# Patient Record
Sex: Female | Born: 1990 | Race: White | Hispanic: No | Marital: Single | Smoking: Current every day smoker
Health system: Southern US, Community
[De-identification: ages and names within clinical notes are randomized; demographics above are authoritative.]

## PROBLEM LIST (undated history)

## (undated) DIAGNOSIS — I498 Other specified cardiac arrhythmias: Secondary | ICD-10-CM

## (undated) DIAGNOSIS — I951 Orthostatic hypotension: Secondary | ICD-10-CM

## (undated) DIAGNOSIS — F419 Anxiety disorder, unspecified: Secondary | ICD-10-CM

## (undated) DIAGNOSIS — R000 Tachycardia, unspecified: Secondary | ICD-10-CM

## (undated) DIAGNOSIS — F32A Depression, unspecified: Secondary | ICD-10-CM

## (undated) DIAGNOSIS — F329 Major depressive disorder, single episode, unspecified: Secondary | ICD-10-CM

## (undated) DIAGNOSIS — G90A Postural orthostatic tachycardia syndrome (POTS): Secondary | ICD-10-CM

---

## 2013-05-28 ENCOUNTER — Encounter (HOSPITAL_COMMUNITY): Payer: Self-pay | Admitting: Emergency Medicine

## 2013-05-28 ENCOUNTER — Emergency Department (HOSPITAL_COMMUNITY)
Admission: EM | Admit: 2013-05-28 | Discharge: 2013-05-28 | Disposition: A | Discharge status: Home / Self Care | Payer: BC Managed Care – PPO | Source: Home / Self Care | Attending: Family Medicine | Admitting: Family Medicine

## 2013-05-28 DIAGNOSIS — J039 Acute tonsillitis, unspecified: Secondary | ICD-10-CM

## 2013-05-28 LAB — POCT RAPID STREP A: Streptococcus, Group A Screen (Direct): NEGATIVE

## 2013-05-28 MED ORDER — CLINDAMYCIN HCL 300 MG PO CAPS: 300.0000 mg | ORAL_CAPSULE | Freq: Four times a day (QID) | ORAL | Status: DC

## 2013-05-28 MED ORDER — ACETAMINOPHEN 325 MG PO TABS
ORAL_TABLET | ORAL | Status: AC
  Filled 2013-05-28: qty 2

## 2013-05-28 MED ORDER — ACETAMINOPHEN 325 MG PO TABS
650.0000 mg | ORAL_TABLET | Freq: Once | ORAL | Status: AC
  Administered 2013-05-28: 650 mg via ORAL

## 2013-05-28 NOTE — ED Notes (Signed)
Pt  Reports  Symptoms   Of  sorethroat     With  Swollen    Glands  And  Fever  Started  2-3  Days  Ago

## 2013-05-28 NOTE — ED Provider Notes (Signed)
CSN: [id number]     Arrival date & time 05/28/13  1412 History   First MD Initiated Contact with Patient 05/28/13 1451     Chief Complaint  Patient presents with  . 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 No difficulty breathing, speaking. Only uncomfortable when swallowing.   Patient is a 23 y.o. female presenting with pharyngitis. The history is provided by the patient.  Sore Throat This is a new problem. Episode onset: 3 days ago. The problem occurs constantly. The problem has been gradually worsening. The symptoms are aggravated by swallowing. Nothing relieves the symptoms.    History reviewed. No pertinent past medical history. History reviewed. No pertinent past surgical history. History reviewed. No pertinent family history. History  Substance Use Topics  . Smoking status: Current Every Day Smoker  . Smokeless tobacco: Not on file  . Alcohol Use: Yes   OB History   Grav Para Term Preterm Abortions TAB SAB Ect Mult Living                 Review of Systems  All other systems reviewed and are negative.   Allergies  Pertussis vaccines  Home Medications   Prior to Admission medications   Not on File   BP 108/75  Pulse 95  Temp(Src) 101.1 F (38.4 C) (Oral)  Resp 16  SpO2 95%  LMP 05/21/2013 Physical Exam  Nursing note and vitals reviewed. Constitutional: She is oriented to person, place, and time. She appears well-developed and well-nourished. No distress.  HENT:  Head: Normocephalic and atraumatic.  Right Ear: Hearing, tympanic membrane, external ear and ear canal normal.  Left Ear: Hearing, tympanic membrane, external ear and ear canal normal.  Nose: Nose normal.  Mouth/Throat: Uvula is midline and mucous membranes are normal. No oral lesions. No trismus in the jaw. No uvula swelling. Oropharyngeal exudate, posterior  oropharyngeal edema and posterior oropharyngeal erythema present. No tonsillar abscesses.  Moderate bilateral tonsillar swelling with erythema and exudate. No clinical evidence of airway obstruction or peritonsillar abscess.   Eyes: Conjunctivae are normal. Right eye exhibits no discharge. Left eye exhibits no discharge. No scleral icterus.  Neck: Normal range of motion. Neck supple.  Cardiovascular: Normal rate, regular rhythm and normal heart sounds.   Pulmonary/Chest: Effort normal and breath sounds normal.  Musculoskeletal: Normal range of motion.  Neurological: She is alert and oriented to person, place, and time.  Skin: Skin is warm and dry. No rash noted.  Psychiatric: She has a normal mood and affect. Her behavior is normal.    ED Course  Procedures (including critical care time) Labs Review Labs Reviewed  POCT RAPID STREP A ([facility] ONLY)    Imaging Review No results found.   MDM   1. Tonsillitis with exudate    Rapid strep negative. Will treat for atypical causes of tonsillitis with clindamycin and advise follow up if no improvement.   Jess BartersJennifer Lee DixonPresson, GeorgiaPA 05/28/13 [phone]

## 2013-05-28 NOTE — ED Provider Notes (Signed)
Medical screening examination/treatment/procedure(s) were performed by a resident physician or non-physician practitioner and as the supervising physician I was immediately available for consultation/collaboration.  Clementeen GrahamEvan Lakynn Halvorsen, MD    Rodolph BongEvan S Emiyah Spraggins, MD 05/28/13 [phone]

## 2013-05-28 NOTE — Discharge Instructions (Signed)
Your strep test was negative. You are being treated for atypical causes of tonsillitis. Medication as directed with tylenol or ibuprofen as directed on packaging for pain and fever. Warm salt water gargles 4 x day until healed.  Tonsillitis Tonsillitis is an infection of the throat that causes the tonsils to become red, tender, and swollen. Tonsils are collections of lymphoid tissue at the back of the throat. Each tonsil has crevices (crypts). Tonsils help fight nose and throat infections and keep infection from spreading to other parts of the body for the first 18 months of life.  CAUSES Sudden (acute) tonsillitis is usually caused by infection with streptococcal bacteria. Long-lasting (chronic) tonsillitis occurs when the crypts of the tonsils become filled with pieces of food and bacteria, which makes it easy for the tonsils to become repeatedly infected. SYMPTOMS  Symptoms of tonsillitis include:  A sore throat, with possible difficulty swallowing.  White patches on the tonsils.  Fever.  Tiredness.  New episodes of snoring during sleep, when you did not snore before.  Small, foul-smelling, yellowish-white pieces of material (tonsilloliths) that you occasionally cough up or spit out. The tonsilloliths can also cause you to have bad breath. DIAGNOSIS Tonsillitis can be diagnosed through a physical exam. Diagnosis can be confirmed with the results of lab tests, including a throat culture. TREATMENT  The goals of tonsillitis treatment include the reduction of the severity and duration of symptoms and prevention of associated conditions. Symptoms of tonsillitis can be improved with the use of steroids to reduce the swelling. Tonsillitis caused by bacteria can be treated with antibiotics. Usually, treatment with antibiotics is started before the cause of the tonsillitis is known. However, if it is determined that the cause is not bacterial, antibiotics will not treat the tonsillitis. If attacks  of tonsillitis are severe and frequent, your caregiver may recommend surgery to remove the tonsils (tonsillectomy). HOME CARE INSTRUCTIONS   Rest as much as possible and get plenty of sleep.  Drink plenty of fluids. While the throat is very sore, eat soft foods or liquids, such as sherbet, soups, or instant breakfast drinks.  Eat frozen ice pops.  Gargle with a warm or cold liquid to help soothe the throat. Mix 1/4 teaspoon of salt and 1/4 teaspoon of baking soda in in 8 oz of water. SEEK MEDICAL CARE IF:   Large, tender lumps develop in your neck.  A rash develops.  A green, yellow-brown, or bloody substance is coughed up.  You are unable to swallow liquids or food for 24 hours.  You notice that only one of the tonsils is swollen. SEEK IMMEDIATE MEDICAL CARE IF:   You develop any new symptoms such as vomiting, severe headache, stiff neck, chest pain, or trouble breathing or swallowing.  You have severe throat pain along with drooling or voice changes.  You have severe pain, unrelieved with recommended medications.  You are unable to fully open the mouth.  You develop redness, swelling, or severe pain anywhere in the neck.  You have a fever. MAKE SURE YOU:   Understand these instructions.  Will watch your condition.  Will get help right away if you are not doing well or get worse. Document Released: 10/22/2004 Document Revised: 09/14/2012 Document Reviewed: 07/01/2012 [facility] Patient Information 2014 [employer] [place].

## 2013-05-30 LAB — CULTURE, GROUP A STREP

## 2013-05-30 NOTE — ED Notes (Signed)
Throat culture: Strep beta hemolytic not group A.  Pt. adequately treated with Clindamycin.  Needs notified. Anne Mendez Anne Mendez 05/30/2013

## 2013-05-31 ENCOUNTER — Telehealth (HOSPITAL_COMMUNITY): Payer: Self-pay | Admitting: *Deleted

## 2013-05-31 NOTE — ED Notes (Addendum)
I called but pt.'s mailbox is full, unable to leave a message.  Call 1. Anne Mendez 05/31/2013 Mailbox full.  Call 2. 06/01/2013 Mailbox full.  Call 3.  I called contact and left a message for pt. to call. 06/04/2013

## 2013-10-29 ENCOUNTER — Encounter (HOSPITAL_COMMUNITY): Payer: Self-pay | Admitting: Emergency Medicine

## 2013-10-29 ENCOUNTER — Emergency Department (INDEPENDENT_AMBULATORY_CARE_PROVIDER_SITE_OTHER)
Admission: EM | Admit: 2013-10-29 | Discharge: 2013-10-29 | Disposition: A | Discharge status: Home / Self Care | Payer: BC Managed Care – PPO | Source: Home / Self Care | Attending: Family Medicine | Admitting: Family Medicine

## 2013-10-29 DIAGNOSIS — J302 Other seasonal allergic rhinitis: Secondary | ICD-10-CM

## 2013-10-29 DIAGNOSIS — T7840XA Allergy, unspecified, initial encounter: Secondary | ICD-10-CM

## 2013-10-29 DIAGNOSIS — B3731 Acute candidiasis of vulva and vagina: Secondary | ICD-10-CM

## 2013-10-29 DIAGNOSIS — Z889 Allergy status to unspecified drugs, medicaments and biological substances status: Secondary | ICD-10-CM

## 2013-10-29 DIAGNOSIS — B373 Candidiasis of vulva and vagina: Secondary | ICD-10-CM

## 2013-10-29 MED ORDER — FLUTICASONE PROPIONATE 50 MCG/ACT NA SUSP: 1.0000 | Freq: Two times a day (BID) | NASAL | Status: DC

## 2013-10-29 MED ORDER — TERCONAZOLE 80 MG VA SUPP: 80.0000 mg | Freq: Every day | VAGINAL | Status: DC

## 2013-10-29 MED ORDER — TRIAMCINOLONE ACETONIDE 40 MG/ML IJ SUSP
40.0000 mg | Freq: Once | INTRAMUSCULAR | Status: AC
  Administered 2013-10-29: 40 mg via INTRAMUSCULAR

## 2013-10-29 MED ORDER — CETIRIZINE HCL 10 MG PO TABS: 10.0000 mg | ORAL_TABLET | Freq: Every day | ORAL | Status: DC

## 2013-10-29 MED ORDER — TRIAMCINOLONE ACETONIDE 40 MG/ML IJ SUSP
INTRAMUSCULAR | Status: AC
  Filled 2013-10-29: qty 1

## 2013-10-29 NOTE — ED Provider Notes (Signed)
CSN: [id number]     Arrival date & time 10/29/13  1341 History   First MD Initiated Contact with Patient 10/29/13 1348     Chief Complaint  Patient presents with  . Sinusitis  . Allergic Reaction   (Consider location/radiation/quality/duration/timing/severity/associated sxs/prior Treatment) Patient is a 23 y.o. female presenting with allergic reaction and rash. The history is provided by the patient.  Allergic Reaction Presenting symptoms: rash   Rash Location:  Full body Quality: redness   Quality: not painful, not scaling and not swelling   Severity:  Mild Onset quality:  Sudden Duration:  2 days Progression:  Spreading Chronicity:  New Context: medications   Context comment:  Has had 2 courses of amox in past 10 d for sinus sx from [facility],, 2d with rash over body and vaginal itch.   History reviewed. No pertinent past medical history. History reviewed. No pertinent past surgical history. No family history on file. History  Substance Use Topics  . Smoking status: Current Every Day Smoker -- 0.33 packs/day    Types: Cigarettes  . Smokeless tobacco: Not on file  . Alcohol Use: Yes   OB History   Grav Para Term Preterm Abortions TAB SAB Ect Mult Living                 Review of Systems  Constitutional: Negative.   Respiratory: Negative.   Cardiovascular: Negative.   Gastrointestinal: Negative.   Genitourinary: Negative.   Skin: Positive for rash.    Allergies  Pertussis vaccines  Home Medications   Prior to Admission medications   Medication Sig Start Date End Date Taking? Authorizing Provider  amoxicillin-clavulanate (AUGMENTIN) 500-125 MG per tablet Take 1 tablet by mouth 3 (three) times daily.   Yes Historical Provider, MD  pseudoephedrine (SUDAFED) 120 MG 12 hr tablet Take 120 mg by mouth 2 (two) times daily.   Yes Historical Provider, MD  cetirizine (ZYRTEC) 10 MG tablet Take 1 tablet (10 mg total) by mouth daily. One tab daily for allergies 10/29/13   Linna Hoff, MD  clindamycin (CLEOCIN) 300 MG capsule Take 1 capsule (300 mg total) by mouth 4 (four) times daily. X 10 days 05/28/13   Mathis Fare Presson, PA  fluticasone (FLONASE) 50 MCG/ACT nasal spray Place 1 spray into both nostrils 2 (two) times daily. 10/29/13   Linna Hoff, MD  terconazole (TERAZOL 3) 80 MG vaginal suppository Place 1 suppository (80 mg total) vaginally at bedtime. 10/29/13   Linna Hoff, MD   BP 133/88  Pulse 72  Temp(Src) 98.6 F (37 C) (Oral)  Resp 18  SpO2 100%  LMP 09/23/2013 Physical Exam  Nursing note and vitals reviewed. Constitutional: She is oriented to person, place, and time. She appears well-developed and well-nourished. No distress.  HENT:  Head: Normocephalic.  Right Ear: External ear normal.  Left Ear: External ear normal.  Nose: Nose normal.  Mouth/Throat: Oropharynx is clear and moist.  Eyes: Conjunctivae and EOM are normal. Pupils are equal, round, and reactive to light.  Neck: Normal range of motion. Neck supple.  Cardiovascular: Normal heart sounds.   Pulmonary/Chest: Effort normal and breath sounds normal.  Abdominal: Soft. Bowel sounds are normal. She exhibits no distension and no mass. There is no tenderness. There is no rebound and no guarding.  Neurological: She is alert and oriented to person, place, and time.  Skin: Skin is warm and dry. Rash noted. There is erythema.  m-p erythem generalized rash c/w drug exanthem.  ED Course  Procedures (including critical care time) Labs Review Labs Reviewed - No data to display  Imaging Review No results found.   MDM   1. Allergic reaction caused by a drug   2. Seasonal allergies   3. Candidal vaginitis        Linna HoffJames D Mendy Chou, MD 10/29/13 1423

## 2013-10-29 NOTE — Discharge Instructions (Signed)
No more amoxicillin, take medicine prescribed and see your doctor as needed.

## 2013-10-29 NOTE — ED Notes (Addendum)
Patient c/o ear pain and nasal congestion with sinus pressure x 9 days. Patient reports she was seen and treated on campus for an ear infection. She  Was given amoxicillin and then began to have sinus pressure and pain. She went back and was given another medication with amoxicillin in it and began to break out in a rash all over trunk and have a yeast infection. She used OTC tx for yeast infection and noticed rash between her thighs. Patient denies fever. Patient is alert and oriented and in NAD.

## 2015-05-25 ENCOUNTER — Emergency Department (HOSPITAL_COMMUNITY)
Admission: EM | Admit: 2015-05-25 | Discharge: 2015-05-25 | Disposition: A | Discharge status: Home / Self Care | Payer: BLUE CROSS/BLUE SHIELD | Attending: Emergency Medicine | Admitting: Emergency Medicine

## 2015-05-25 ENCOUNTER — Encounter (HOSPITAL_COMMUNITY): Payer: Self-pay | Admitting: Emergency Medicine

## 2015-05-25 ENCOUNTER — Emergency Department (HOSPITAL_COMMUNITY): Payer: BLUE CROSS/BLUE SHIELD

## 2015-05-25 DIAGNOSIS — F17210 Nicotine dependence, cigarettes, uncomplicated: Secondary | ICD-10-CM | POA: Diagnosis not present

## 2015-05-25 DIAGNOSIS — S61210A Laceration without foreign body of right index finger without damage to nail, initial encounter: Secondary | ICD-10-CM | POA: Insufficient documentation

## 2015-05-25 DIAGNOSIS — Z23 Encounter for immunization: Secondary | ICD-10-CM | POA: Diagnosis not present

## 2015-05-25 DIAGNOSIS — F329 Major depressive disorder, single episode, unspecified: Secondary | ICD-10-CM | POA: Insufficient documentation

## 2015-05-25 DIAGNOSIS — S61209A Unspecified open wound of unspecified finger without damage to nail, initial encounter: Secondary | ICD-10-CM

## 2015-05-25 DIAGNOSIS — Y9389 Activity, other specified: Secondary | ICD-10-CM | POA: Diagnosis not present

## 2015-05-25 DIAGNOSIS — Z791 Long term (current) use of non-steroidal anti-inflammatories (NSAID): Secondary | ICD-10-CM | POA: Diagnosis not present

## 2015-05-25 DIAGNOSIS — W290XXA Contact with powered kitchen appliance, initial encounter: Secondary | ICD-10-CM | POA: Diagnosis not present

## 2015-05-25 DIAGNOSIS — Y990 Civilian activity done for income or pay: Secondary | ICD-10-CM | POA: Insufficient documentation

## 2015-05-25 DIAGNOSIS — Y92513 Shop (commercial) as the place of occurrence of the external cause: Secondary | ICD-10-CM | POA: Diagnosis not present

## 2015-05-25 HISTORY — DX: Tachycardia, unspecified: R00.0

## 2015-05-25 HISTORY — DX: Anxiety disorder, unspecified: F41.9

## 2015-05-25 HISTORY — DX: Postural orthostatic tachycardia syndrome (POTS): G90.A

## 2015-05-25 HISTORY — DX: Orthostatic hypotension: I95.1

## 2015-05-25 HISTORY — DX: Major depressive disorder, single episode, unspecified: F32.9

## 2015-05-25 HISTORY — DX: Depression, unspecified: F32.A

## 2015-05-25 HISTORY — DX: Other specified cardiac arrhythmias: I49.8

## 2015-05-25 MED ORDER — HYDROCODONE-ACETAMINOPHEN 5-325 MG PO TABS
1.0000 | ORAL_TABLET | Freq: Once | ORAL | Status: DC
  Filled 2015-05-25: qty 1

## 2015-05-25 MED ORDER — LIDOCAINE HCL 2 % IJ SOLN
10.0000 mL | Freq: Once | INTRAMUSCULAR | Status: AC
  Administered 2015-05-25: 200 mg via INTRADERMAL
  Filled 2015-05-25: qty 20

## 2015-05-25 MED ORDER — TETANUS-DIPHTH-ACELL PERTUSSIS 5-2.5-18.5 LF-MCG/0.5 IM SUSP
0.5000 mL | Freq: Once | INTRAMUSCULAR | Status: AC
  Administered 2015-05-25: 0.5 mL via INTRAMUSCULAR
  Filled 2015-05-25: qty 0.5

## 2015-05-25 NOTE — ED Provider Notes (Signed)
CSN: [id number]     Arrival date & time 05/25/15  2030 History   First MD Initiated Contact with Patient 05/25/15 2222     Chief Complaint  Patient presents with  . Extremity Laceration    right index finger    HPI Comments: 25 year old right hand dominant female presents with right index finger laceration. 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 Her supervisor immediately wrapped her finger and had her come to the ER. Wound was cleaned  Patient denies loss of sensation, loss of ROM, or continued bleeding, but is endorsing intense burning pain. She is not up to date on tetanus.      Past Medical History  Diagnosis Date  . POTS (postural orthostatic tachycardia syndrome)   . Anxiety   . Depression    History reviewed. No pertinent past surgical history. History reviewed. No pertinent family history. Social History  Substance Use Topics  . Smoking status: Current Every Day Smoker -- 0.33 packs/day    Types: Cigarettes  . Smokeless tobacco: None  . Alcohol Use: Yes     Comment: occ   OB History    No data available     Review of Systems  Musculoskeletal: Positive for arthralgias.  Skin: Positive for wound.  All other systems reviewed and are negative.   Allergies  Amoxicillin and Pertussis vaccines  Home Medications   Prior to Admission medications   Medication Sig Start Date End Date Taking? Authorizing Provider  ibuprofen (ADVIL,MOTRIN) 200 MG tablet Take 400-800 mg by mouth every 6 (six) hours as needed (for pain.).   Yes Historical Provider, MD   BP 123/86 mmHg  Pulse 93  Temp(Src) 97.9 F (36.6 C) (Oral)  Resp 18  Ht  (1.6 m)  Wt 52.164 kg  BMI 20.38 kg/m2  SpO2 100%  LMP 04/28/2015 (Approximate)   Physical Exam  Constitutional: She is oriented to person, place, and time. She appears well-developed and well-nourished. No distress.  HENT:  Head: Normocephalic and  atraumatic.  Eyes: Conjunctivae are normal. Pupils are equal, round, and reactive to light. Right eye exhibits no discharge. Left eye exhibits no discharge. No scleral icterus.  Neck: Normal range of motion.  Cardiovascular: Normal rate and regular rhythm.   Pulmonary/Chest: Effort normal and breath sounds normal. No respiratory distress. She has no wheezes. She has no rales. She exhibits no tenderness.  Musculoskeletal:  R index finger: No obvious swelling. Avulsion of distal pad of finger noted. Bleeding controlled. N/V intact. FROM of DIP and PIP joint.  Neurological: She is alert and oriented to person, place, and time.  Skin: Skin is warm and dry.  Psychiatric: Her mood appears anxious.       ED Course  Procedures (including critical care time) Labs Review Labs Reviewed - No data to display  Imaging Review Dg Finger Index Right  05/25/2015  CLINICAL DATA:  Right index finger laceration. EXAM: RIGHT INDEX FINGER 2+V COMPARISON:  None. FINDINGS: There is no evidence of fracture or dislocation. There is no evidence of arthropathy or other focal bone abnormality. No radiopaque foreign body is noted. IMPRESSION: No fracture or dislocation is noted. No radiopaque foreign body is noted. Electronically Signed   By: Lupita Raider, M.D.   On: 05/25/2015 21:14   I have personally reviewed and evaluated these images and lab results as part of my medical  decision-making.   EKG Interpretation None      MDM   Final diagnoses:  Finger avulsion, initial encounter   25 year old female who presents with a right index finger avulsion. The wound is very superficial and not amenable to suturing as it would likely cause more harm. Tetanus given. Norco offered however patient declined. Digital block was performed with 2% Lidocaine so wound could be thoroughly cleaned and provide some analgesia. Wound irrigated with sterile water. Wound was dressed with non-stick dressing and wound care  instructions given. Patient is NAD with stable VS. Patient informed of clinical course, understands medical decision-making process, and agrees with plan.     Bethel BornKelly Marie Lazette Estala, PA-C 05/26/15 1155  Mancel BaleElliott Wentz, MD 05/27/15 [phone]

## 2015-05-25 NOTE — ED Notes (Signed)
Patient states she was cutting meat on a meat slicer when she cut her finger. Patient had finger wrapped in paper towels. Paper towel removed with saline. Laceration noted to the finger tip of the right index finger. Finger was wrapped with sterile guaze. Patient is unsure when she had her last tetanus shot.

## 2017-09-18 IMAGING — CR DG FINGER INDEX 2+V*R*
3 series · 3 of 3 positions shown · non-contrast
Comparison: None.

CLINICAL DATA: Right index finger laceration.

EXAM:
RIGHT INDEX FINGER 2+V

[x finger pa right]
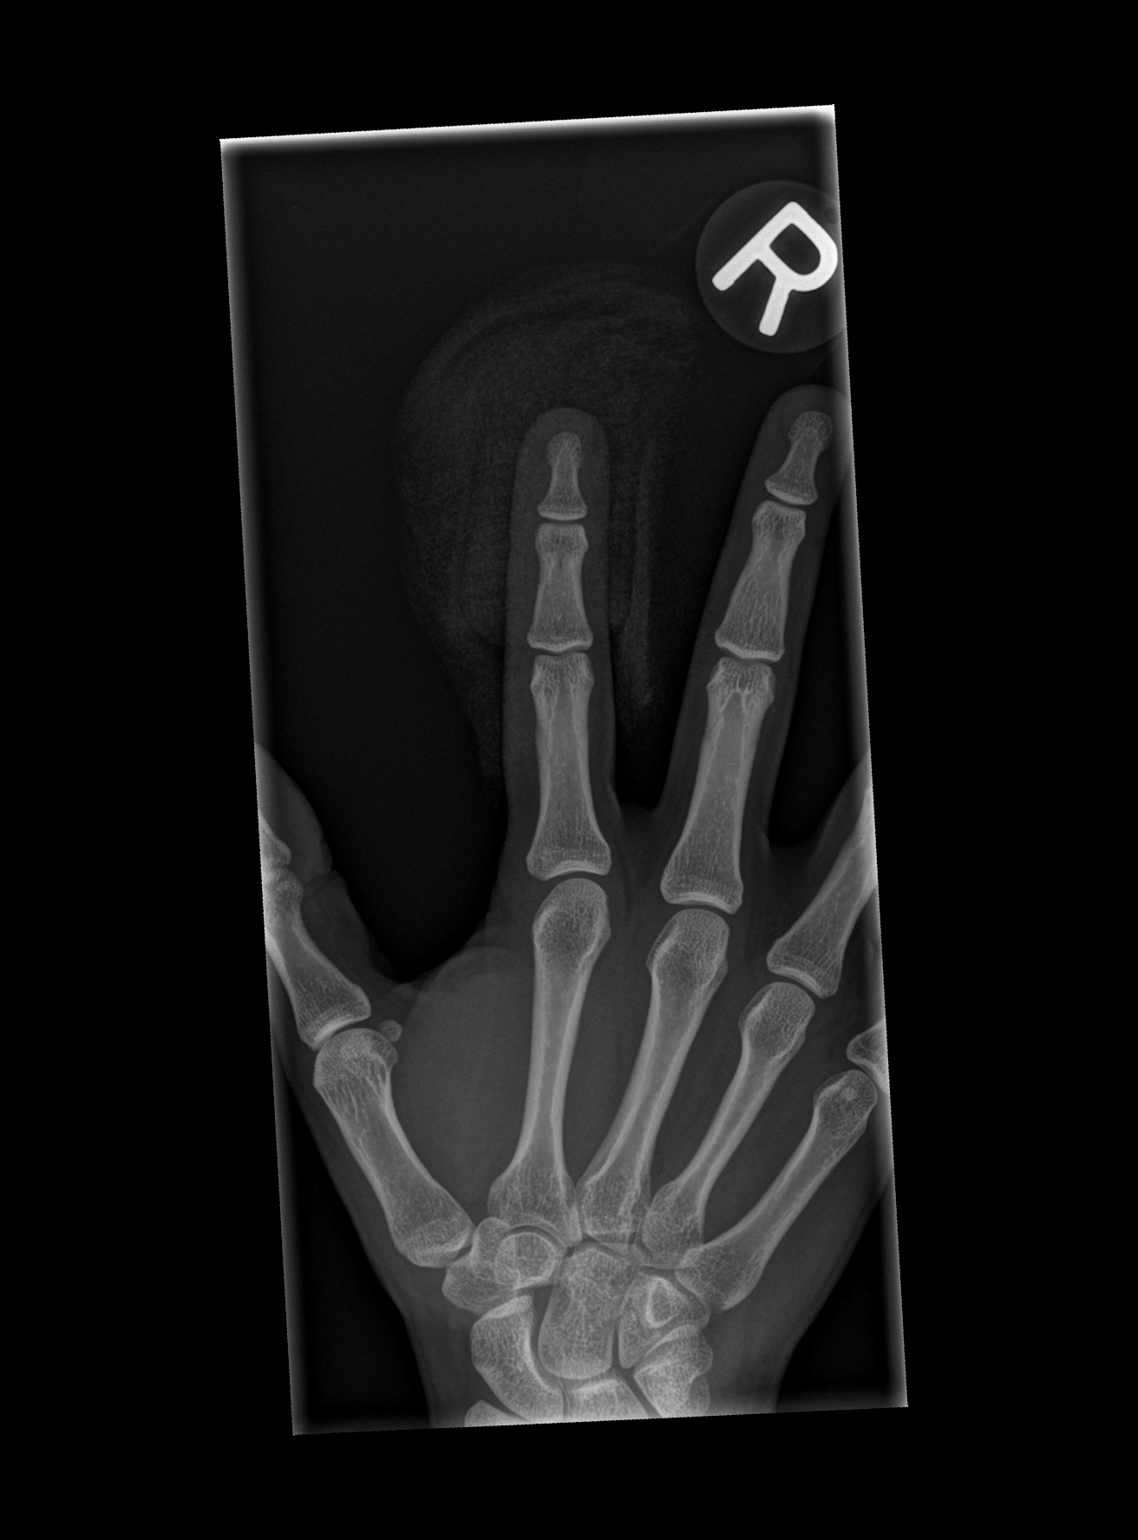

[x finger obl right]
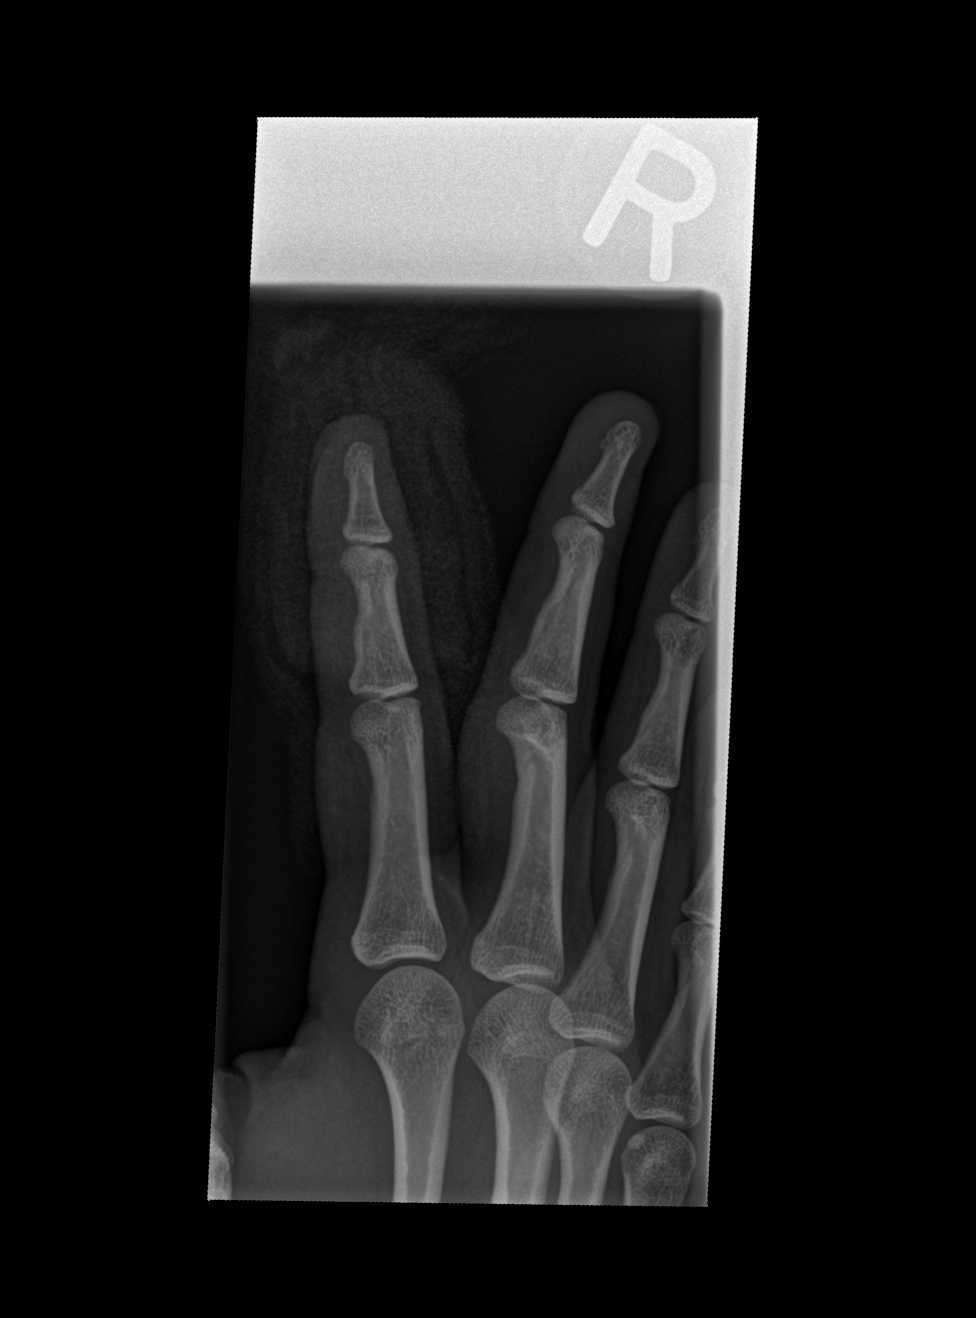

[x finger lat right]
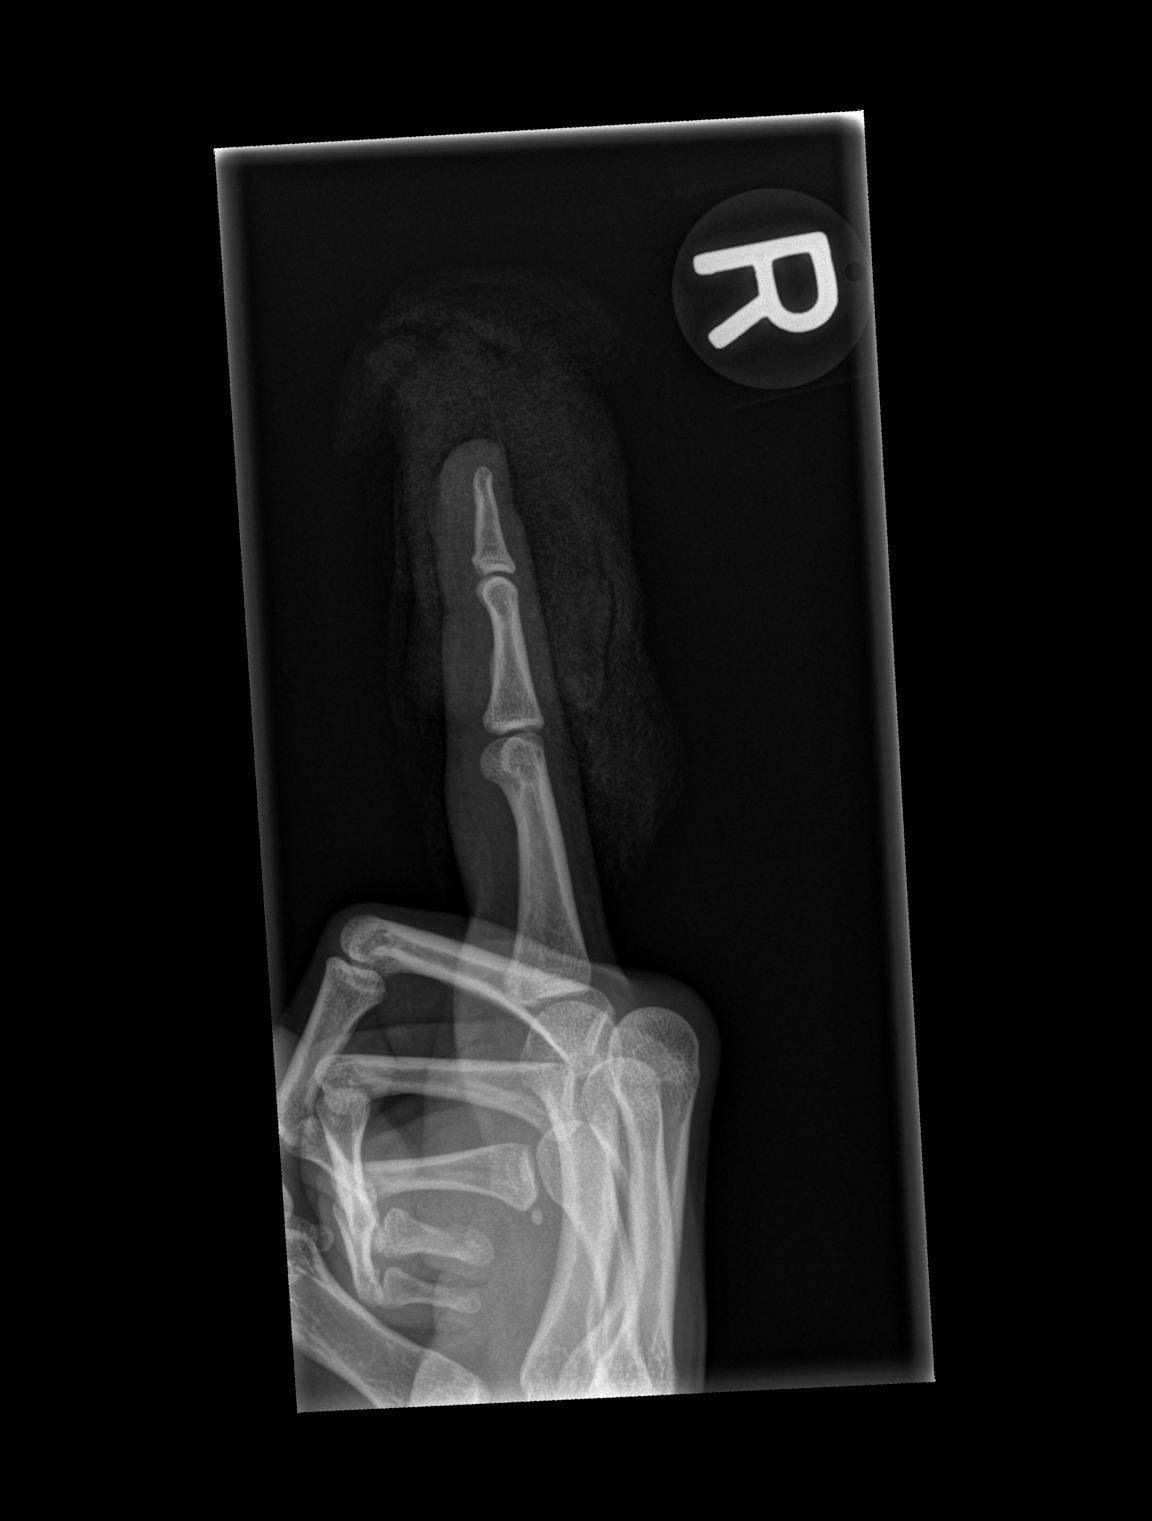

[3 of 3 positions shown; findings below may reference images not displayed]

FINDINGS: There is no evidence of fracture or dislocation. There is no
evidence of arthropathy or other focal bone abnormality. No
radiopaque foreign body is noted.
IMPRESSION: No fracture or dislocation is noted. No radiopaque foreign body is
noted.

## 2017-11-09 ENCOUNTER — Encounter (HOSPITAL_COMMUNITY): Payer: Self-pay

## 2017-11-09 ENCOUNTER — Ambulatory Visit (HOSPITAL_COMMUNITY)
Admission: EM | Admit: 2017-11-09 | Discharge: 2017-11-09 | Disposition: A | Discharge status: Home / Self Care | Payer: BLUE CROSS/BLUE SHIELD | Attending: Family Medicine | Admitting: Family Medicine

## 2017-11-09 DIAGNOSIS — F419 Anxiety disorder, unspecified: Secondary | ICD-10-CM | POA: Insufficient documentation

## 2017-11-09 DIAGNOSIS — F17210 Nicotine dependence, cigarettes, uncomplicated: Secondary | ICD-10-CM | POA: Insufficient documentation

## 2017-11-09 DIAGNOSIS — Z79899 Other long term (current) drug therapy: Secondary | ICD-10-CM | POA: Insufficient documentation

## 2017-11-09 DIAGNOSIS — F329 Major depressive disorder, single episode, unspecified: Secondary | ICD-10-CM | POA: Insufficient documentation

## 2017-11-09 DIAGNOSIS — J029 Acute pharyngitis, unspecified: Secondary | ICD-10-CM

## 2017-11-09 LAB — POCT RAPID STREP A: Streptococcus, Group A Screen (Direct): NEGATIVE

## 2017-11-09 MED ORDER — LIDOCAINE VISCOUS HCL 2 % MT SOLN: OROMUCOSAL | 0 refills | Status: AC

## 2017-11-09 MED ORDER — IPRATROPIUM BROMIDE 0.06 % NA SOLN: 2.0000 | Freq: Four times a day (QID) | NASAL | 0 refills | Status: AC

## 2017-11-09 NOTE — Discharge Instructions (Signed)
Rapid strep negative. Symptoms are most likely due to viral illness/ drainage down your throat. Start lidocaine for sore throat, do not eat or drink for the next 40 mins after use as it can stunt your gag reflex. Atrovent for nasal congestion/drainage. You can use over the counter nasal saline rinse such as neti pot for nasal congestion. Monitor for any worsening of symptoms, swelling of the throat, trouble breathing, trouble swallowing, leaning forward to breath, drooling, go to the emergency department for further evaluation needed. ° °For sore throat/cough try using a honey-based tea. Use 3 teaspoons of honey with juice squeezed from half lemon. Place shaved pieces of ginger into 1/2-1 cup of water and warm over stove top. Then mix the ingredients and repeat every 4 hours as needed. °

## 2017-11-09 NOTE — ED Triage Notes (Signed)
Pt c/o sore throat x4days.

## 2017-11-09 NOTE — ED Provider Notes (Signed)
[facility]    CSN: [id number] Arrival date & time: 11/09/17  1552     History   Chief Complaint Chief Complaint  Patient presents with  . Sore Throat    HPI Anne Mendez is a 27 y.o. female.   27 year old female comes in for 4 day history of sore throat. She has also had post nasal drip and nasal congestion. Denies cough. Denies fever, chills, night sweats. Has been increasing fluid intake due to symptoms. Has not taken medication for the symptoms. Current every day smoker.      Past Medical History:  Diagnosis Date  . Anxiety   . Depression   . POTS (postural orthostatic tachycardia syndrome)     There are no active problems to display for this patient.   History reviewed. No pertinent surgical history.  OB History   None      Home Medications    Prior to Admission medications   Medication Sig Start Date End Date Taking? Authorizing Provider  ibuprofen (ADVIL,MOTRIN) 200 MG tablet Take 400-800 mg by mouth every 6 (six) hours as needed (for pain.).    [provider]  ipratropium (ATROVENT) 0.06 % nasal spray Place 2 sprays into both nostrils 4 (four) times daily. 11/09/17   Cathie Hoops, Amy V, PA-C  lidocaine (XYLOCAINE) 2 % solution 5-15 mL gurgle as needed 11/09/17   Belinda Fisher, PA-C    Family History No family history on file.  Social History Social History   Tobacco Use  . Smoking status: Current Every Day Smoker    Packs/day: 0.33    Types: Cigarettes  Substance Use Topics  . Alcohol use: Yes    Comment: occ  . Drug use: Yes    Frequency: 7.0 times per week    Types: Marijuana     Allergies   Amoxicillin and Pertussis vaccines   Review of Systems Review of Systems  Reason unable to perform ROS: See HPI as above.     Physical Exam Triage Vital Signs ED Triage Vitals  Enc Vitals Group     BP 11/09/17 1612 116/80     Pulse Rate 11/09/17 1612 80     Resp 11/09/17 1612 18     Temp 11/09/17 1612 98.2 F (36.8 C)       Temp Source 11/09/17 1612 Oral     SpO2 11/09/17 1612 100 %     Weight 11/09/17 1618 125 lb (56.7 kg)     Height --      Head Circumference --      Peak Flow --      Pain Score --      Pain Loc --      Pain Edu? --      Excl. in GC? --    No data found.  Updated Vital Signs BP 116/80 (BP Location: Left Arm)   Pulse 80   Temp 98.2 F (36.8 C) (Oral)   Resp 18   Wt 125 lb (56.7 kg)   LMP 10/26/2017   SpO2 100%   BMI 22.14 kg/m   Physical Exam  Constitutional: She is oriented to person, place, and time. She appears well-developed and well-nourished.  Non-toxic appearance. She does not appear ill. No distress.  HENT:  Head: Normocephalic and atraumatic.  Right Ear: Tympanic membrane, external ear and ear canal normal. Tympanic membrane is not erythematous and not bulging.  Left Ear: Tympanic membrane, external ear and ear canal normal. Tympanic membrane is not  erythematous and not bulging.  Nose: Nose normal. Right sinus exhibits no maxillary sinus tenderness and no frontal sinus tenderness. Left sinus exhibits no maxillary sinus tenderness and no frontal sinus tenderness.  Mouth/Throat: Uvula is midline and mucous membranes are normal. Posterior oropharyngeal erythema present. Tonsils are 1+ on the right. Tonsils are 1+ on the left. No tonsillar exudate.  Eyes: Pupils are equal, round, and reactive to light. Conjunctivae are normal.  Neck: Normal range of motion. Neck supple.  Cardiovascular: Normal rate, regular rhythm and normal heart sounds. Exam reveals no gallop and no friction rub.  No murmur heard. Pulmonary/Chest: Effort normal and breath sounds normal. She has no decreased breath sounds. She has no wheezes. She has no rhonchi. She has no rales.  Lymphadenopathy:    She has no cervical adenopathy.  Neurological: She is alert and oriented to person, place, and time.  Skin: Skin is warm and dry.  Psychiatric: She has a normal mood and affect. Her behavior is normal.  Judgment normal.     UC Treatments / Results  Labs (all labs ordered are listed, but only abnormal results are displayed) Labs Reviewed  CULTURE, GROUP A STREP [facility])  POCT RAPID STREP A    EKG None  Radiology No results found.  Procedures Procedures (including critical care time)  Medications Ordered in UC Medications - No data to display  Initial Impression / Assessment and Plan / UC Course  I have reviewed the triage vital signs and the nursing notes.  Pertinent labs & imaging results that were available during my care of the patient were reviewed by me and considered in my medical decision making (see chart for details).    Rapid strep negative. Patient is nontoxic in appearance. Symptomatic treatment as needed. Return precautions given.   Final Clinical Impressions(s) / UC Diagnoses   Final diagnoses:  Pharyngitis, unspecified etiology    ED Prescriptions    Medication Sig Dispense Auth. Provider   lidocaine (XYLOCAINE) 2 % solution 5-15 mL gurgle as needed 150 mL Yu, Amy V, PA-C   ipratropium (ATROVENT) 0.06 % nasal spray Place 2 sprays into both nostrils 4 (four) times daily. 15 mL Threasa Alpha, [place] 11/09/17 1646

## 2017-11-12 LAB — CULTURE, GROUP A STREP (THRC)

## 2019-04-03 ENCOUNTER — Ambulatory Visit: Payer: BLUE CROSS/BLUE SHIELD | Attending: Internal Medicine

## 2019-04-03 DIAGNOSIS — Z23 Encounter for immunization: Secondary | ICD-10-CM | POA: Insufficient documentation

## 2019-04-03 NOTE — Progress Notes (Signed)
   Covid-19 Vaccination Clinic  Name:  Tristian Bouska    MRN: [id number] DOB: 1990/09/13  04/03/2019  Ms. Duerson was observed post Covid-19 immunization for 30 minutes based on pre-vaccination screening without incident. She was provided with Vaccine Information Sheet and instruction to access the V-Safe system.   Ms. Kadel was instructed to call 911 with any severe reactions post vaccine: Marland Kitchen Difficulty breathing  . Swelling of face and throat  . A fast heartbeat  . A bad rash all over body  . Dizziness and weakness   Immunizations Administered    Name Date Dose VIS Date Route   Pfizer COVID-19 Vaccine 04/03/2019  6:21 PM 0.3 mL 01/06/2019 Intramuscular   Manufacturer: [employer], [employer]   Lot: [id number]   NDC: [address]-8

## 2019-05-03 ENCOUNTER — Ambulatory Visit: Payer: BLUE CROSS/BLUE SHIELD | Attending: Internal Medicine

## 2019-05-03 DIAGNOSIS — Z23 Encounter for immunization: Secondary | ICD-10-CM

## 2019-05-03 NOTE — Progress Notes (Signed)
   [facility]ov[facility] Vaccination [facility]  Name:  Anne Mendez    MRN: [id number] DOB: 1990/05/03  05/03/2019  [name] was observed post Covid-19 immunization for 30 minutes based on pre-vaccination screening without incident. She was provided with Vaccine Information Sheet and instruction to access the V-Safe system.   [name] was instructed to call 911 with any severe reactions post vaccine: Marland Kitchen Difficulty breathing  . Swelling of face and throat  . A fast heartbeat  . A bad rash all over body  . Dizziness and weakness   Immunizations Administered    Name Date Dose VIS Date Route   Pfizer COVID-19 Vaccine 05/03/2019  2:25 PM 0.3 mL 01/06/2019 Intramuscular   Manufacturer: [employer], [employer]   Lot: [id number]   NDC: [address]-0
